# Patient Record
Sex: Male | Born: 1995 | Hispanic: No | Marital: Single | Smoking: Current every day smoker
Health system: Southern US, Community
[De-identification: ages and names within clinical notes are randomized; demographics above are authoritative.]

---
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
## 2017-03-02 DIAGNOSIS — R509 Fever, unspecified: Secondary | ICD-10-CM | POA: Diagnosis not present

## 2017-03-02 DIAGNOSIS — J069 Acute upper respiratory infection, unspecified: Secondary | ICD-10-CM | POA: Diagnosis not present

## 2017-08-01 DIAGNOSIS — H5213 Myopia, bilateral: Secondary | ICD-10-CM | POA: Diagnosis not present

## 2017-08-01 DIAGNOSIS — H52223 Regular astigmatism, bilateral: Secondary | ICD-10-CM | POA: Diagnosis not present

## 2017-10-26 DIAGNOSIS — M25521 Pain in right elbow: Secondary | ICD-10-CM | POA: Diagnosis not present

## 2017-10-26 DIAGNOSIS — S43001A Unspecified subluxation of right shoulder joint, initial encounter: Secondary | ICD-10-CM | POA: Diagnosis not present

## 2018-07-10 DIAGNOSIS — H52223 Regular astigmatism, bilateral: Secondary | ICD-10-CM | POA: Diagnosis not present

## 2018-07-10 DIAGNOSIS — H5213 Myopia, bilateral: Secondary | ICD-10-CM | POA: Diagnosis not present

## 2019-01-15 DIAGNOSIS — Z0000 Encounter for general adult medical examination without abnormal findings: Secondary | ICD-10-CM | POA: Diagnosis not present

## 2019-01-15 DIAGNOSIS — Z23 Encounter for immunization: Secondary | ICD-10-CM | POA: Diagnosis not present

## 2019-01-15 DIAGNOSIS — R000 Tachycardia, unspecified: Secondary | ICD-10-CM | POA: Diagnosis not present

## 2019-11-14 DIAGNOSIS — H9201 Otalgia, right ear: Secondary | ICD-10-CM | POA: Diagnosis not present

## 2020-01-18 DIAGNOSIS — Z0000 Encounter for general adult medical examination without abnormal findings: Secondary | ICD-10-CM | POA: Diagnosis not present

## 2020-04-29 ENCOUNTER — Ambulatory Visit
Admission: RE | Admit: 2020-04-29 | Discharge: 2020-04-29 | Disposition: A | Discharge status: Home / Self Care | Payer: BC Managed Care – PPO | Source: Ambulatory Visit | Attending: Sports Medicine | Admitting: Sports Medicine

## 2020-04-29 ENCOUNTER — Ambulatory Visit (INDEPENDENT_AMBULATORY_CARE_PROVIDER_SITE_OTHER): Payer: BC Managed Care – PPO | Admitting: Sports Medicine

## 2020-04-29 ENCOUNTER — Other Ambulatory Visit: Payer: Self-pay

## 2020-04-29 VITALS — BP 118/72 | Ht 71.0 in | Wt 174.0 lb

## 2020-04-29 DIAGNOSIS — M25572 Pain in left ankle and joints of left foot: Secondary | ICD-10-CM

## 2020-04-29 DIAGNOSIS — M7989 Other specified soft tissue disorders: Secondary | ICD-10-CM | POA: Diagnosis not present

## 2020-04-29 NOTE — Progress Notes (Signed)
   Subjective:    Patient ID: Mark Vasquez    DOB: 1996/04/23, 23 y.o.   MRN: [id number]  HPI Mark Vasquez is a 24 yo male with no significant PMH presenting for pain in the lateral ankle and lower leg after dropping a metal hatch onto his ankle at work about one week ago. The latch slipped and struck him just above the left malleolus, causing pain, swelling, and bruising down to the heel. The swelling has reduced a good bit over the past week.  He enjoys running almost daily and has been able to continue doing this except for the day after the injury. However, while walking he has some pain and often feels like the area is loose or slightly unstable. He has been able to continue working. He has taken aleve which does help the pain.     Review of Systems Per HPI    Objective:   Physical Exam  Constitution: NAD, healthy appearing HEENT: [place]/AT MSK: TTP over left distal fibular, no pain with syndesmosis squeeze, lateral malleolus, heel, dorsal or lateral aspect of foot. Mild pain with passive supination. Otherwise full active and passive ROM without pain, strength 5/5  Neuro: sensation intact along left ankle and foot Skin: left foot: scab over lateral distal fibula, hematoma along lateral heel      Assessment & Plan:   Traumatic Injury Left Fibula Likely this is bruising along the bone of the lateral fibula as he has been able to continue exercising and working, although cannot rule out hairline fracture where he was struck. Will obtain x-ray of the ankle and distal fibular, discuss results this afternoon and proceed from there.    - xr with marker  Anijah Spohr A, DO 04/29/2020, 12:04 PM Pager: [phone]  Patient seen and evaluated with the resident.  I agree with the above plan of care.  Patient is tender to palpation along the distal third of his fibula but x-rays show no obvious fracture.  Patient was reassured of these x-ray findings and he is okay to increase activity as tolerated.   He will follow-up for ongoing or recalcitrant issues.

## 2020-04-30 ENCOUNTER — Encounter: Payer: Self-pay | Admitting: Sports Medicine

## 2020-09-10 DIAGNOSIS — Z20828 Contact with and (suspected) exposure to other viral communicable diseases: Secondary | ICD-10-CM | POA: Diagnosis not present

## 2020-09-13 DIAGNOSIS — R55 Syncope and collapse: Secondary | ICD-10-CM | POA: Diagnosis not present

## 2021-10-23 IMAGING — DX DG ANKLE COMPLETE 3+V*L*
3 series · 3 of 3 positions shown · non-contrast
Comparison: None.

CLINICAL DATA: Dropped heavy object on lateral ankle 1 week ago
with persistent pain, initial encounter

EXAM:
LEFT ANKLE COMPLETE - 3+ VIEW

[dg ankle complete left (1 of 3)]
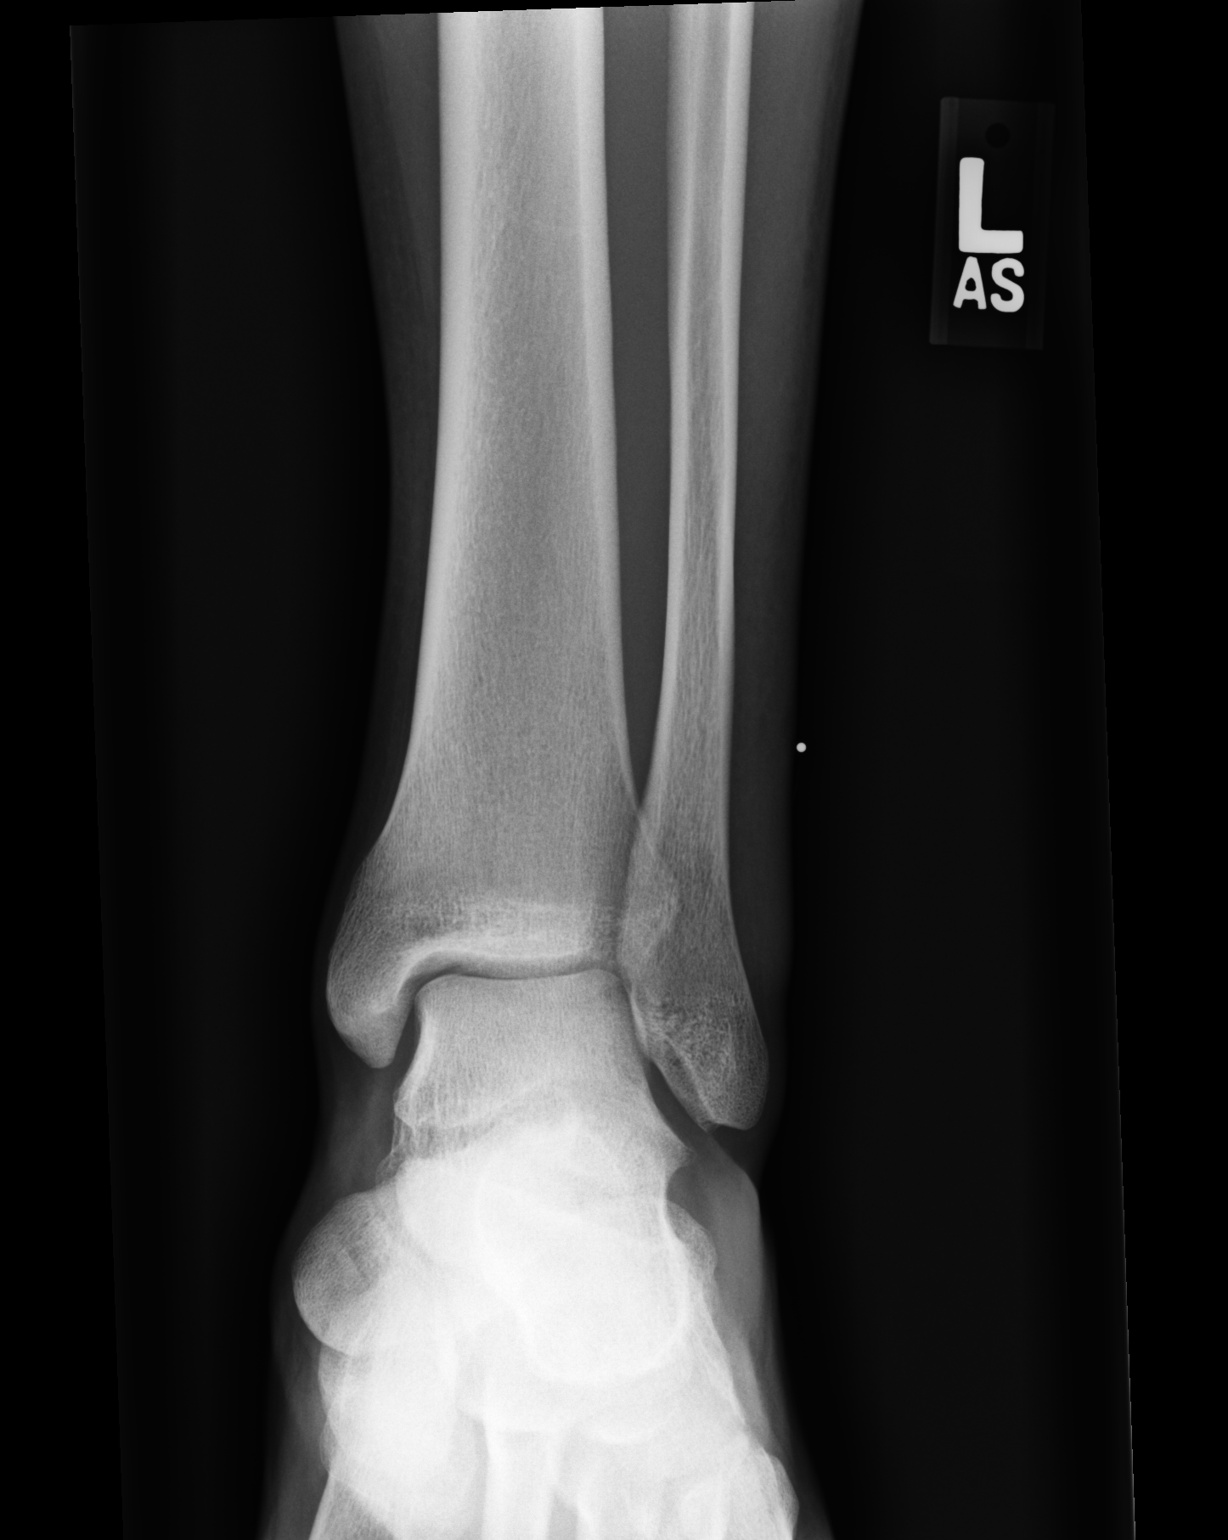

[dg ankle complete left (2 of 3)]
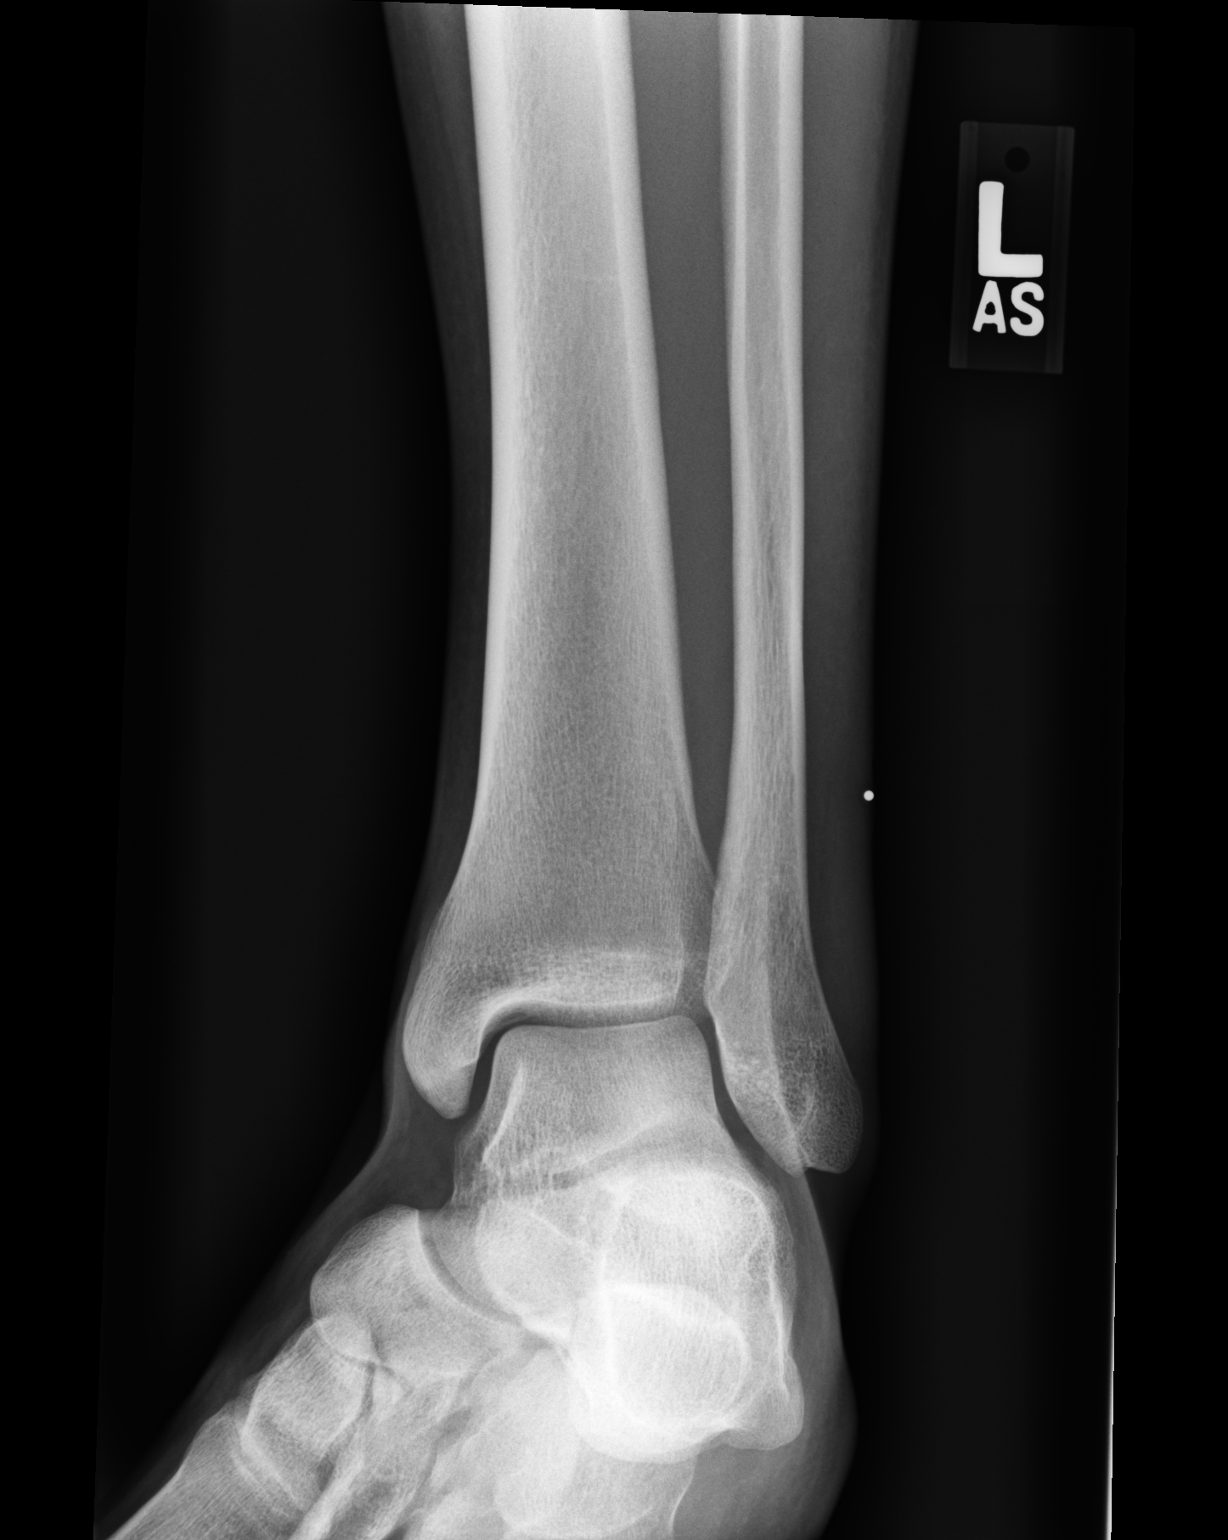

[dg ankle complete left (3 of 3)]
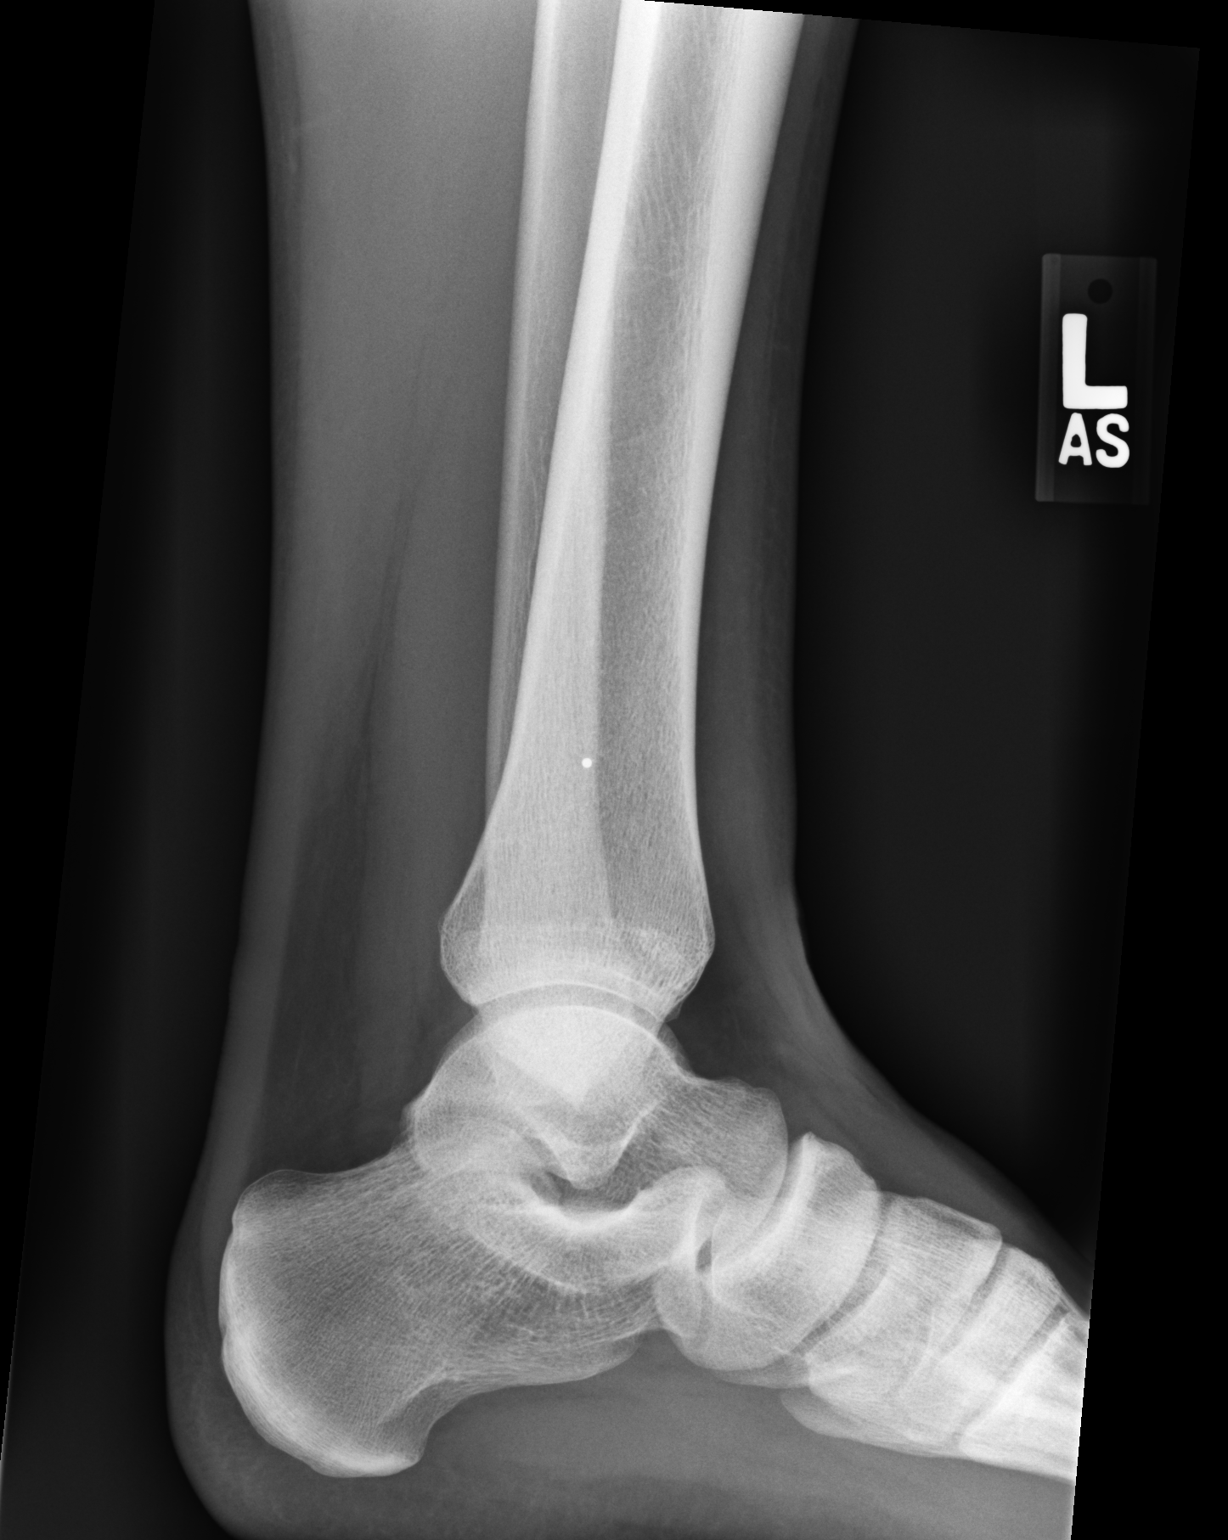

[3 of 3 positions shown; findings below may reference images not displayed]

FINDINGS: Mild soft tissue swelling is noted along the distal fibula
laterally. No acute fracture or dislocation is noted. This is likely
related to the recent injury.
IMPRESSION: Soft tissue swelling without acute bony abnormality.

## 2022-07-15 DIAGNOSIS — R7309 Other abnormal glucose: Secondary | ICD-10-CM | POA: Diagnosis not present

## 2022-07-15 DIAGNOSIS — Z0000 Encounter for general adult medical examination without abnormal findings: Secondary | ICD-10-CM | POA: Diagnosis not present

## 2023-08-29 DIAGNOSIS — Z0000 Encounter for general adult medical examination without abnormal findings: Secondary | ICD-10-CM | POA: Diagnosis not present

## 2023-08-29 DIAGNOSIS — K625 Hemorrhage of anus and rectum: Secondary | ICD-10-CM | POA: Diagnosis not present

## 2023-08-29 DIAGNOSIS — R7309 Other abnormal glucose: Secondary | ICD-10-CM | POA: Diagnosis not present

## 2023-08-29 DIAGNOSIS — E669 Obesity, unspecified: Secondary | ICD-10-CM | POA: Diagnosis not present

## 2023-09-06 DIAGNOSIS — K625 Hemorrhage of anus and rectum: Secondary | ICD-10-CM | POA: Diagnosis not present

## 2023-09-06 DIAGNOSIS — E669 Obesity, unspecified: Secondary | ICD-10-CM | POA: Diagnosis not present

## 2023-09-14 DIAGNOSIS — K648 Other hemorrhoids: Secondary | ICD-10-CM | POA: Diagnosis not present

## 2023-09-14 DIAGNOSIS — K633 Ulcer of intestine: Secondary | ICD-10-CM | POA: Diagnosis not present

## 2023-09-14 DIAGNOSIS — K625 Hemorrhage of anus and rectum: Secondary | ICD-10-CM | POA: Diagnosis not present

## 2023-09-14 DIAGNOSIS — Z1211 Encounter for screening for malignant neoplasm of colon: Secondary | ICD-10-CM | POA: Diagnosis not present

## 2024-08-28 DIAGNOSIS — K529 Noninfective gastroenteritis and colitis, unspecified: Secondary | ICD-10-CM | POA: Diagnosis not present

## 2024-08-28 DIAGNOSIS — E669 Obesity, unspecified: Secondary | ICD-10-CM | POA: Diagnosis not present

## 2024-08-28 DIAGNOSIS — Z0000 Encounter for general adult medical examination without abnormal findings: Secondary | ICD-10-CM | POA: Diagnosis not present

## 2024-08-28 DIAGNOSIS — Z720 Tobacco use: Secondary | ICD-10-CM | POA: Diagnosis not present
